# Patient Record
Sex: Female | Born: 1995 | Race: Black or African American | Hispanic: Yes | Marital: Single | Smoking: Never smoker
Health system: Southern US, Community
[De-identification: ages and names within clinical notes are randomized; demographics above are authoritative.]

## PROBLEM LIST (undated history)

## (undated) ENCOUNTER — Inpatient Hospital Stay (HOSPITAL_COMMUNITY): Payer: Self-pay

## (undated) DIAGNOSIS — R5383 Other fatigue: Secondary | ICD-10-CM

## (undated) DIAGNOSIS — N83209 Unspecified ovarian cyst, unspecified side: Secondary | ICD-10-CM

## (undated) DIAGNOSIS — F419 Anxiety disorder, unspecified: Secondary | ICD-10-CM

## (undated) HISTORY — PX: WISDOM TOOTH EXTRACTION: SHX21

---

## 2017-01-12 ENCOUNTER — Encounter (HOSPITAL_COMMUNITY): Payer: Self-pay

## 2017-01-12 ENCOUNTER — Inpatient Hospital Stay (HOSPITAL_COMMUNITY): Payer: Medicaid Other

## 2017-01-12 ENCOUNTER — Inpatient Hospital Stay (HOSPITAL_COMMUNITY)
Admission: AD | Admit: 2017-01-12 | Discharge: 2017-01-12 | Disposition: A | Discharge status: Home / Self Care | Payer: Medicaid Other | Source: Ambulatory Visit | Attending: Obstetrics and Gynecology | Admitting: Obstetrics and Gynecology

## 2017-01-12 DIAGNOSIS — Z3A10 10 weeks gestation of pregnancy: Secondary | ICD-10-CM

## 2017-01-12 DIAGNOSIS — O26899 Other specified pregnancy related conditions, unspecified trimester: Secondary | ICD-10-CM

## 2017-01-12 DIAGNOSIS — O219 Vomiting of pregnancy, unspecified: Secondary | ICD-10-CM

## 2017-01-12 DIAGNOSIS — D259 Leiomyoma of uterus, unspecified: Secondary | ICD-10-CM

## 2017-01-12 DIAGNOSIS — O23591 Infection of other part of genital tract in pregnancy, first trimester: Secondary | ICD-10-CM | POA: Diagnosis not present

## 2017-01-12 DIAGNOSIS — O210 Mild hyperemesis gravidarum: Secondary | ICD-10-CM | POA: Insufficient documentation

## 2017-01-12 DIAGNOSIS — B9689 Other specified bacterial agents as the cause of diseases classified elsewhere: Secondary | ICD-10-CM | POA: Diagnosis not present

## 2017-01-12 DIAGNOSIS — O3411 Maternal care for benign tumor of corpus uteri, first trimester: Secondary | ICD-10-CM | POA: Insufficient documentation

## 2017-01-12 DIAGNOSIS — O26891 Other specified pregnancy related conditions, first trimester: Secondary | ICD-10-CM

## 2017-01-12 DIAGNOSIS — N760 Acute vaginitis: Secondary | ICD-10-CM

## 2017-01-12 DIAGNOSIS — R109 Unspecified abdominal pain: Secondary | ICD-10-CM | POA: Diagnosis present

## 2017-01-12 DIAGNOSIS — Z3491 Encounter for supervision of normal pregnancy, unspecified, first trimester: Secondary | ICD-10-CM

## 2017-01-12 HISTORY — DX: Unspecified ovarian cyst, unspecified side: N83.209

## 2017-01-12 HISTORY — DX: Anxiety disorder, unspecified: F41.9

## 2017-01-12 LAB — WET PREP, GENITAL
SPERM: NONE SEEN
Trich, Wet Prep: NONE SEEN
Yeast Wet Prep HPF POC: NONE SEEN

## 2017-01-12 LAB — CBC
HCT: 36.6 % (ref 36.0–46.0)
Hemoglobin: 12.4 g/dL (ref 12.0–15.0)
MCH: 28.7 pg (ref 26.0–34.0)
MCHC: 33.9 g/dL (ref 30.0–36.0)
MCV: 84.7 fL (ref 78.0–100.0)
PLATELETS: 322 10*3/uL (ref 150–400)
RBC: 4.32 MIL/uL (ref 3.87–5.11)
RDW: 12.6 % (ref 11.5–15.5)
WBC: 4.7 10*3/uL (ref 4.0–10.5)

## 2017-01-12 LAB — URINALYSIS, ROUTINE W REFLEX MICROSCOPIC
BILIRUBIN URINE: NEGATIVE
Bacteria, UA: NONE SEEN
GLUCOSE, UA: NEGATIVE mg/dL
Hgb urine dipstick: NEGATIVE
KETONES UR: NEGATIVE mg/dL
LEUKOCYTES UA: NEGATIVE
Nitrite: NEGATIVE
PH: 5 (ref 5.0–8.0)
PROTEIN: 30 mg/dL — AB
Specific Gravity, Urine: 1.027 (ref 1.005–1.030)

## 2017-01-12 LAB — ABO/RH: ABO/RH(D): B POS

## 2017-01-12 LAB — POCT PREGNANCY, URINE: Preg Test, Ur: POSITIVE — AB

## 2017-01-12 LAB — HCG, QUANTITATIVE, PREGNANCY: hCG, Beta Chain, Quant, S: 147709 m[IU]/mL — ABNORMAL HIGH (ref <5)

## 2017-01-12 MED ORDER — METRONIDAZOLE 500 MG PO TABS: 500.0000 mg | ORAL_TABLET | Freq: Two times a day (BID) | ORAL | 0 refills | Status: AC

## 2017-01-12 MED ORDER — METOCLOPRAMIDE HCL 10 MG PO TABS: 10.0000 mg | ORAL_TABLET | Freq: Four times a day (QID) | ORAL | 0 refills | Status: AC

## 2017-01-12 NOTE — MAU Note (Signed)
Pt reports she is preg and is vomiting constantly. Lower abd pain x one week.

## 2017-01-12 NOTE — MAU Provider Note (Signed)
History     CSN: [id number]  Arrival date and time: 01/12/17 0254   First Provider Initiated Contact with Patient 01/12/17 [phone]      Chief Complaint  Patient presents with  . Possible Pregnancy  . Abdominal Pain  . Emesis   HPI Crystal Patton is a 21 y.o. G3P0020 at [redacted]w[redacted]d by LMP who presents with abdominal pain & n/v. Symptoms began the beginning of September. Reports daily nausea/vomiting; vomited 3 times per day. Was rx nausea meds by hospital [facility]; last took last night. Suprapubic cramping pain that is constant since September. Rates pain 7/10. Has not treated. Denies diarrhea, constipation, vaginal discharge, or vaginal bleeding.  OB History    Gravida Para Term Preterm AB Living   3       2     SAB TAB Ectopic Multiple Live Births   2              Past Medical History:  Diagnosis Date  . Anxiety   . Ovarian cyst     Past Surgical History:  Procedure Laterality Date  . WISDOM TOOTH EXTRACTION      History reviewed. No pertinent family history.  Social History  Substance Use Topics  . Smoking status: Never Smoker  . Smokeless tobacco: Never Used  . Alcohol use Yes     Comment: before pregnancy    Allergies: Allergies not on file  No prescriptions prior to admission.    Review of Systems  Constitutional: Negative.   Gastrointestinal: Positive for abdominal pain, nausea and vomiting. Negative for constipation and diarrhea.  Genitourinary: Negative for dysuria, hematuria, vaginal bleeding and vaginal discharge.   Physical Exam   Blood pressure 122/79, pulse 86, temperature 98.6 F (37 C), temperature source Oral, resp. rate 16, last menstrual period 11/03/2016, SpO2 99 %.  Physical Exam  Nursing note and vitals reviewed. Constitutional: She is oriented to person, place, and time. She appears well-developed and well-nourished. No distress.  HENT:  Head: Normocephalic and atraumatic.  Eyes: Conjunctivae are normal. Right eye exhibits no discharge.  Left eye exhibits no discharge. No scleral icterus.  Neck: Normal range of motion.  Cardiovascular: Normal rate, regular rhythm and normal heart sounds.   No murmur heard. Respiratory: Effort normal and breath sounds normal. No respiratory distress. She has no wheezes.  GI: Soft. Bowel sounds are normal. There is no tenderness. There is no rebound and no guarding.  Genitourinary: Uterus is enlarged (10-12 weeks). Cervix exhibits no motion tenderness.  Genitourinary Comments: Cervix closed/thick  Neurological: She is alert and oriented to person, place, and time.  Skin: Skin is warm and dry. She is not diaphoretic.  Psychiatric: She has a normal mood and affect. Her behavior is normal. Judgment and thought content normal.    MAU Course  Procedures Results for orders placed or performed during the hospital encounter of 01/12/17 (from the past 24 hour(s))  Urinalysis, Routine w reflex microscopic     Status: Abnormal   Collection Time: 01/12/17  8:50 AM  Result Value Ref Range   Color, Urine AMBER (A) YELLOW   APPearance HAZY (A) CLEAR   Specific Gravity, Urine 1.027 1.005 - 1.030   pH 5.0 5.0 - 8.0   Glucose, UA NEGATIVE NEGATIVE mg/dL   Hgb urine dipstick NEGATIVE NEGATIVE   Bilirubin Urine NEGATIVE NEGATIVE   Ketones, ur NEGATIVE NEGATIVE mg/dL   Protein, ur 30 (A) NEGATIVE mg/dL   Nitrite NEGATIVE NEGATIVE   Leukocytes, UA NEGATIVE NEGATIVE  RBC / HPF 0-5 0 - 5 RBC/hpf   WBC, UA 6-30 0 - 5 WBC/hpf   Bacteria, UA NONE SEEN NONE SEEN   Squamous Epithelial / LPF 6-30 (A) NONE SEEN   Mucus PRESENT   Pregnancy, urine POC     Status: Abnormal   Collection Time: 01/12/17  9:03 AM  Result Value Ref Range   Preg Test, Ur POSITIVE (A) NEGATIVE  Wet prep, genital     Status: Abnormal   Collection Time: 01/12/17  9:42 AM  Result Value Ref Range   Yeast Wet Prep HPF POC NONE SEEN NONE SEEN   Trich, Wet Prep NONE SEEN NONE SEEN   Clue Cells Wet Prep HPF POC PRESENT (A) NONE SEEN    WBC, Wet Prep HPF POC FEW (A) NONE SEEN   Sperm NONE SEEN   CBC     Status: None   Collection Time: 01/12/17  9:45 AM  Result Value Ref Range   WBC 4.7 4.0 - 10.5 K/uL   RBC 4.32 3.87 - 5.11 MIL/uL   Hemoglobin 12.4 12.0 - 15.0 g/dL   HCT 36.6 36.0 - 46.0 %   MCV 84.7 78.0 - 100.0 fL   MCH 28.7 26.0 - 34.0 pg   MCHC 33.9 30.0 - 36.0 g/dL   RDW 12.6 11.5 - 15.5 %   Platelets 322 150 - 400 K/uL  ABO/Rh     Status: None   Collection Time: 01/12/17  9:45 AM  Result Value Ref Range   ABO/RH(D) B POS   hCG, quantitative, pregnancy     Status: Abnormal   Collection Time: 01/12/17  9:45 AM  Result Value Ref Range   hCG, Beta Chain, Quant, S 147,709 (H) <5 mIU/mL   US Ob Comp Less 14 Wks  Result Date: 01/12/2017 CLINICAL DATA:  Lower pelvic pain for 1 week EXAM: OBSTETRIC <14 WK ULTRASOUND TECHNIQUE: Transabdominal ultrasound was performed for evaluation of the gestation as well as the maternal uterus and adnexal regions. COMPARISON:  None. FINDINGS: Intrauterine gestational sac: Single Yolk sac:  Visualized Embryo:  Visualized Cardiac Activity: Visualized Heart Rate: 168 bpm MSD:   mm    w     d CRL:   31.8  mm   10 w 0 d                  [place] EDC: 08/10/2017 Subchorionic hemorrhage:  None visualized. Maternal uterus/adnexae: Single uterine fibroid measures up to 2.9 cm. No adnexal masses or free fluid. IMPRESSION: Ten week intrauterine pregnancy. Fetal heart rate 168 beats per minute. No acute maternal findings. Electronically Signed   By: Rolm Baptise M.D.   On: 01/12/2017 10:40    MDM +UPT UA, wet prep, GC/chlamydia, CBC, ABO/Rh, quant hCG, HIV, and [place] today to rule out ectopic pregnancy B positive Ultrasound shows SIUP measuring 10 wks with cardiac activity Assessment and Plan  A: 1. Normal IUP (intrauterine pregnancy) on prenatal ultrasound, first trimester   2. Abdominal pain affecting pregnancy   3. [redacted] weeks gestation of pregnancy   4. BV (bacterial vaginosis)   5. Uterine  fibroids affecting pregnancy in first trimester   6. Nausea and vomiting in pregnancy prior to [redacted] weeks gestation    P: Discharge home Rx flagyl & reglan Discussed reasons to return to MAU Start prenatal care GC/CT pending   Jorje Guild 01/12/2017, 9:33 AM

## 2017-01-12 NOTE — Discharge Instructions (Signed)
Morning Sickness Morning sickness is when you feel sick to your stomach (nauseous) during pregnancy. This nauseous feeling may or may not come with vomiting. It often occurs in the morning but can be a problem any time of day. Morning sickness is most common during the first trimester, but it may continue throughout pregnancy. While morning sickness is unpleasant, it is usually harmless unless you develop severe and continual vomiting (hyperemesis gravidarum). This condition requires more intense treatment. What are the causes? The cause of morning sickness is not completely known but seems to be related to normal hormonal changes that occur in pregnancy. What increases the risk? You are at greater risk if you:  Experienced nausea or vomiting before your pregnancy.  Had morning sickness during a previous pregnancy.  Are pregnant with more than one baby, such as twins.  How is this treated? Do not use any medicines (prescription, over-the-counter, or herbal) for morning sickness without first talking to your health care provider. Your health care provider may prescribe or recommend:  Vitamin B6 supplements.  Anti-nausea medicines.  The herbal medicine ginger.  Follow these instructions at home:  Only take over-the-counter or prescription medicines as directed by your health care provider.  Taking multivitamins before getting pregnant can prevent or decrease the severity of morning sickness in most women.  Eat a piece of dry toast or unsalted crackers before getting out of bed in the morning.  Eat five or six small meals a day.  Eat dry and bland foods (rice, baked potato). Foods high in carbohydrates are often helpful.  Do not drink liquids with your meals. Drink liquids between meals.  Avoid greasy, fatty, and spicy foods.  Get someone to cook for you if the smell of any food causes nausea and vomiting.  If you feel nauseous after taking prenatal vitamins, take the vitamins at  night or with a snack.  Snack on protein foods (nuts, yogurt, cheese) between meals if you are hungry.  Eat unsweetened gelatins for desserts.  Wearing an acupressure wristband (worn for sea sickness) may be helpful.  Acupuncture may be helpful.  Do not smoke.  Get a humidifier to keep the air in your house free of odors.  Get plenty of fresh air. Contact a health care provider if:  Your home remedies are not working, and you need medicine.  You feel dizzy or lightheaded.  You are losing weight. Get help right away if:  You have persistent and uncontrolled nausea and vomiting.  You pass out (faint). This information is not intended to replace advice given to you by your health care provider. Make sure you discuss any questions you have with your health care provider. Document Released: 04/30/2006 Document Revised: 08/15/2015 Document Reviewed: 08/24/2012 Elsevier Interactive Patient Education  2017 Elsevier Inc.  Bacterial Vaginosis Bacterial vaginosis is a vaginal infection that occurs when the normal balance of bacteria in the vagina is disrupted. It results from an overgrowth of certain bacteria. This is the most common vaginal infection among women ages 3-44. Because bacterial vaginosis increases your risk for STIs (sexually transmitted infections), getting treated can help reduce your risk for chlamydia, gonorrhea, herpes, and HIV (human immunodeficiency virus). Treatment is also important for preventing complications in pregnant women, because this condition can cause an early (premature) delivery. What are the causes? This condition is caused by an increase in harmful bacteria that are normally present in small amounts in the vagina. However, the reason that the condition develops is not fully understood.  What increases the risk? The following factors may make you more likely to develop this condition:  Having a new sexual partner or multiple sexual partners.  Having  unprotected sex.  Douching.  Having an intrauterine device (IUD).  Smoking.  Drug and alcohol abuse.  Taking certain antibiotic medicines.  Being pregnant.  You cannot get bacterial vaginosis from toilet seats, bedding, swimming pools, or contact with objects around you. What are the signs or symptoms? Symptoms of this condition include:  Grey or white vaginal discharge. The discharge can also be watery or foamy.  A fish-like odor with discharge, especially after sexual intercourse or during menstruation.  Itching in and around the vagina.  Burning or pain with urination.  Some women with bacterial vaginosis have no signs or symptoms. How is this diagnosed? This condition is diagnosed based on:  Your medical history.  A physical exam of the vagina.  Testing a sample of vaginal fluid under a microscope to look for a large amount of bad bacteria or abnormal cells. Your health care provider may use a cotton swab or a small wooden spatula to collect the sample.  How is this treated? This condition is treated with antibiotics. These may be given as a pill, a vaginal cream, or a medicine that is put into the vagina (suppository). If the condition comes back after treatment, a second round of antibiotics may be needed. Follow these instructions at home: Medicines  Take over-the-counter and prescription medicines only as told by your health care provider.  Take or use your antibiotic as told by your health care provider. Do not stop taking or using the antibiotic even if you start to feel better. General instructions  If you have a female sexual partner, tell her that you have a vaginal infection. She should see her health care provider and be treated if she has symptoms. If you have a female sexual partner, he does not need treatment.  During treatment: ? Avoid sexual activity until you finish treatment. ? Do not douche. ? Avoid alcohol as directed by your health care  provider. ? Avoid breastfeeding as directed by your health care provider.  Drink enough water and fluids to keep your urine clear or pale yellow.  Keep the area around your vagina and rectum clean. ? Wash the area daily with warm water. ? Wipe yourself from front to back after using the toilet.  Keep all follow-up visits as told by your health care provider. This is important. How is this prevented?  Do not douche.  Wash the outside of your vagina with warm water only.  Use protection when having sex. This includes latex condoms and dental dams.  Limit how many sexual partners you have. To help prevent bacterial vaginosis, it is best to have sex with just one partner (monogamous).  Make sure you and your sexual partner are tested for STIs.  Wear cotton or cotton-lined underwear.  Avoid wearing tight pants and pantyhose, especially during summer.  Limit the amount of alcohol that you drink.  Do not use any products that contain nicotine or tobacco, such as cigarettes and e-cigarettes. If you need help quitting, ask your health care provider.  Do not use illegal drugs. Where to find more information:  Centers for Disease Control and Prevention: AppraiserFraud.fi  American Sexual Health Association (ASHA): www.ashastd.org  U.S. Department of Health and Financial controller, Office on Women's Health: DustingSprays.pl or SecuritiesCard.it Contact a health care provider if:  Your symptoms do not improve, even  after treatment.  You have more discharge or pain when urinating.  You have a fever.  You have pain in your abdomen.  You have pain during sex.  You have vaginal bleeding between periods. Summary  Bacterial vaginosis is a vaginal infection that occurs when the normal balance of bacteria in the vagina is disrupted.  Because bacterial vaginosis increases your risk for STIs (sexually transmitted infections), getting treated can  help reduce your risk for chlamydia, gonorrhea, herpes, and HIV (human immunodeficiency virus). Treatment is also important for preventing complications in pregnant women, because the condition can cause an early (premature) delivery.  This condition is treated with antibiotic medicines. These may be given as a pill, a vaginal cream, or a medicine that is put into the vagina (suppository). This information is not intended to replace advice given to you by your health care provider. Make sure you discuss any questions you have with your health care provider. Document Released: 03/09/2005 Document Revised: 11/23/2015 Document Reviewed: 11/23/2015 Elsevier Interactive Patient Education  2017 [employer].

## 2017-01-13 LAB — CULTURE, OB URINE: Culture: NO GROWTH

## 2017-01-13 LAB — GC/CHLAMYDIA PROBE AMP (~~LOC~~) NOT AT ARMC
Chlamydia: NEGATIVE
NEISSERIA GONORRHEA: NEGATIVE

## 2017-11-02 ENCOUNTER — Encounter (HOSPITAL_COMMUNITY): Payer: Self-pay

## 2018-06-11 IMAGING — US US OB COMP LESS 14 WK
1 series · 15 of 28 positions shown · non-contrast
Comparison: None.

CLINICAL DATA: Lower pelvic pain for 1 week

EXAM:
OBSTETRIC <14 WK ULTRASOUND
TECHNIQUE: Transabdominal ultrasound was performed for evaluation of the
gestation as well as the maternal uterus and adnexal regions.

[Series 1: us ob comp less 14 wk · 15 of 40 slices shown]
[im 1/40]
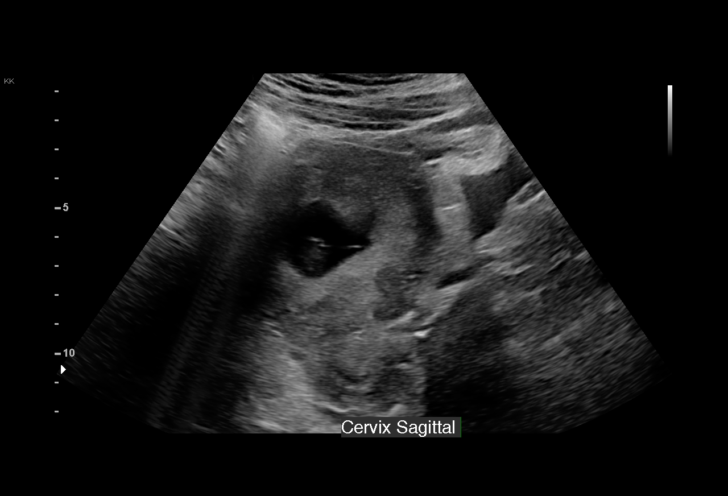
[im 3/40]
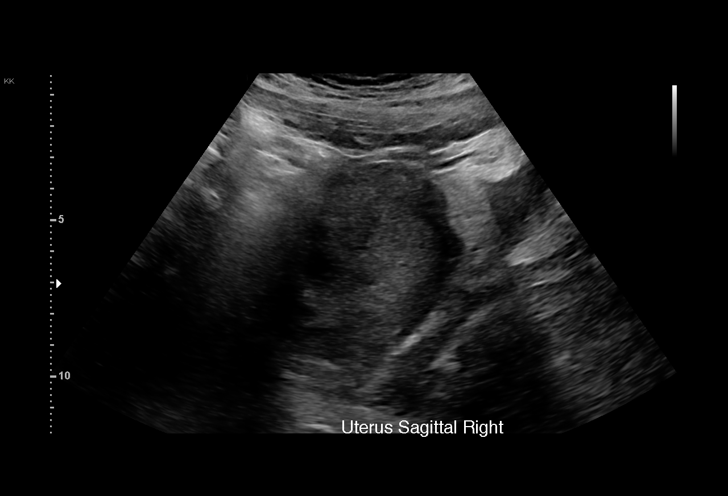
[im 6/40]
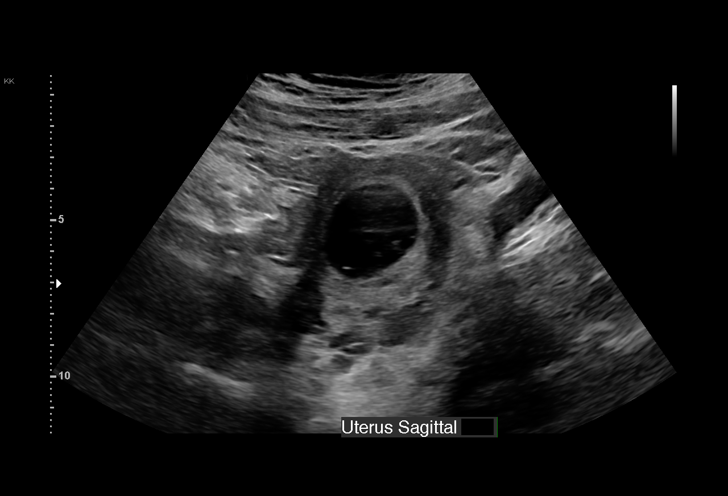
[im 9/40]
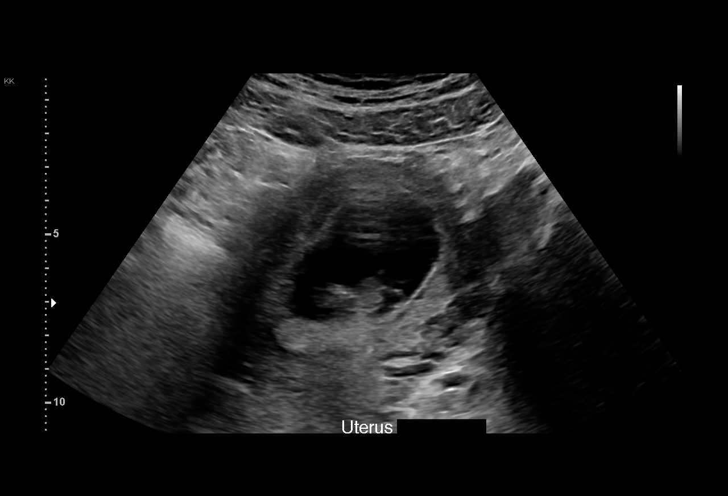
[im 12/40]
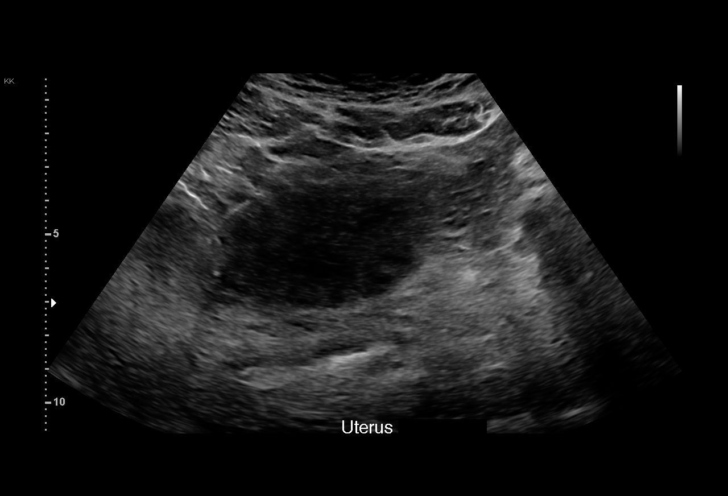
[im 15/40]
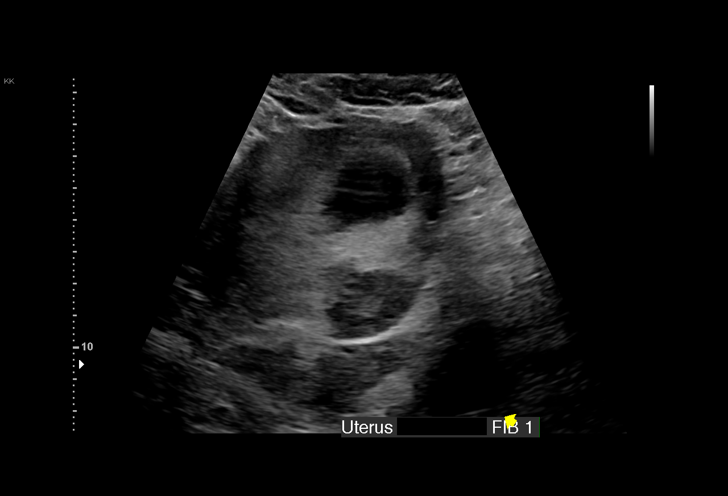
[im 18/40]
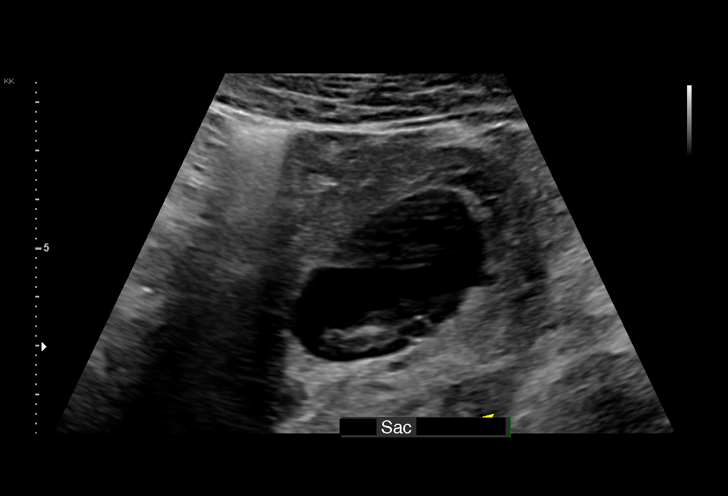
[im 21/40]
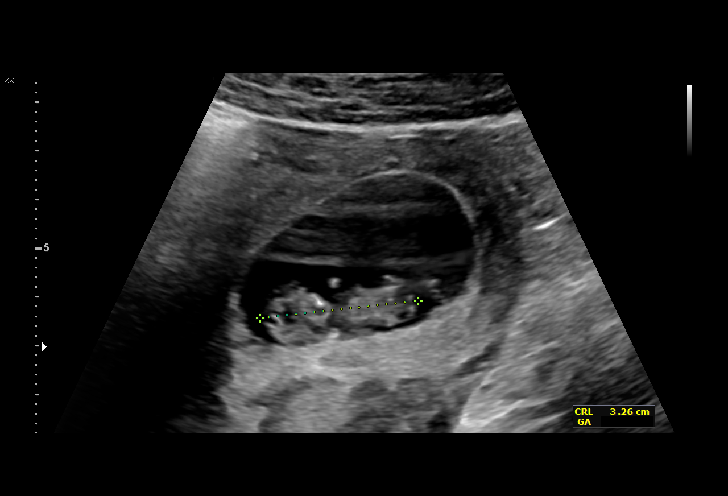
[im 22/40]
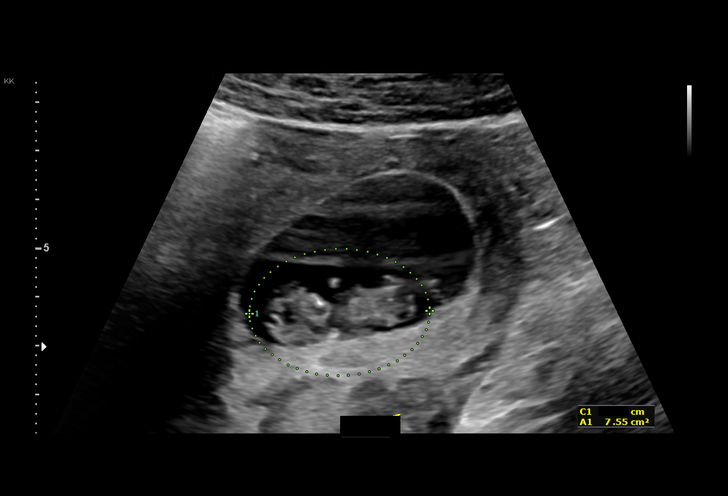
[im 25/40]
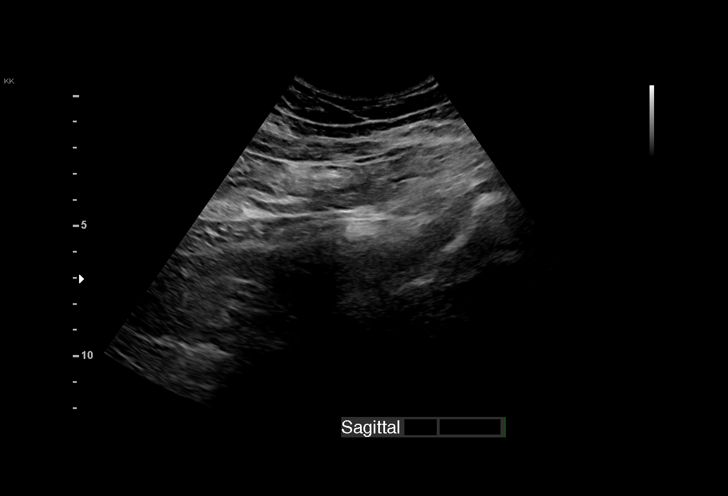
[im 28/40]
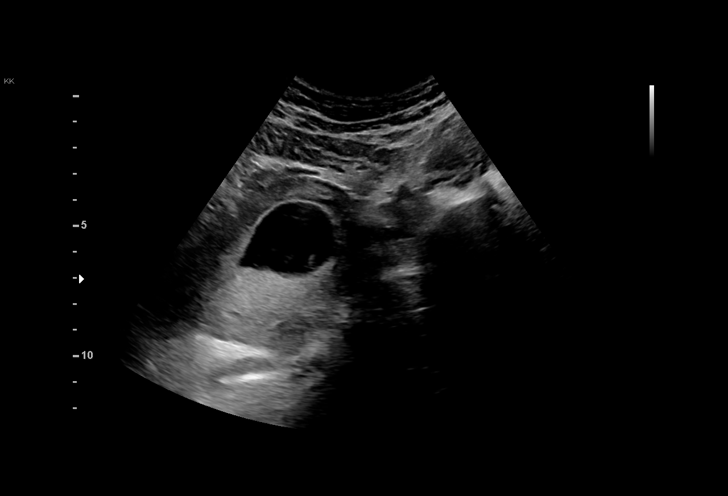
[im 31/40]
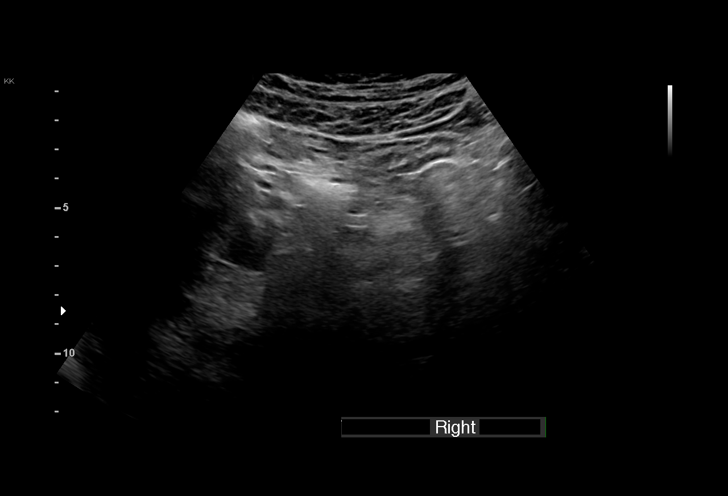
[im 34/40]
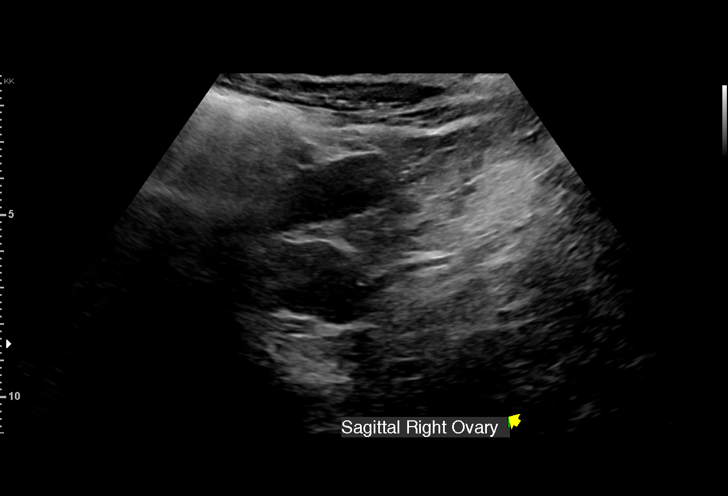
[im 37/40]
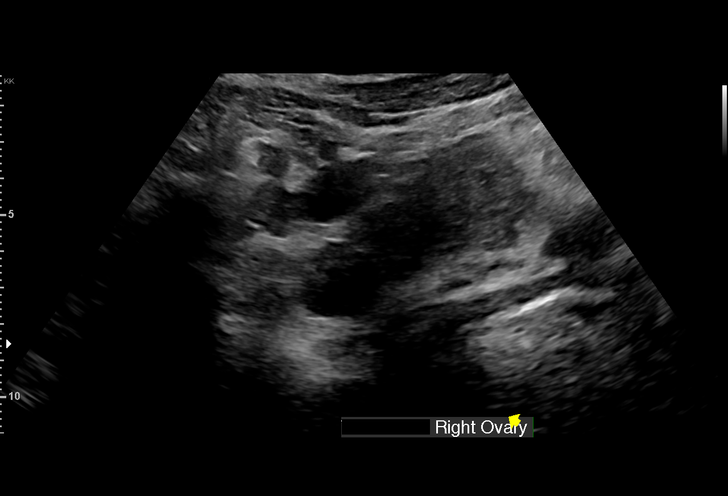
[im 40/40]
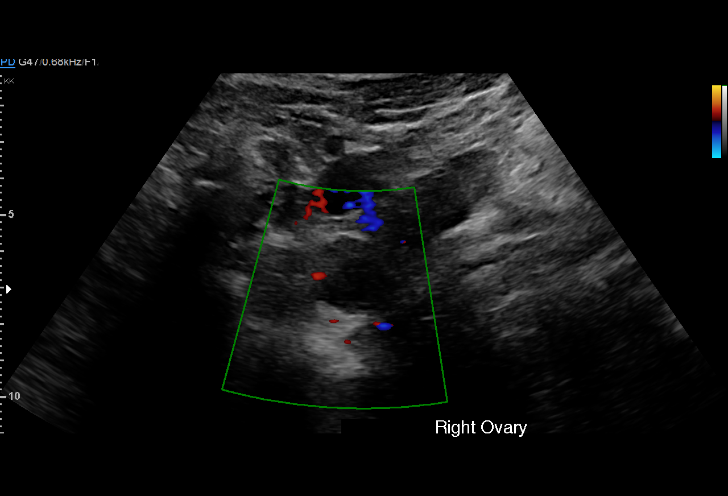

[15 of 28 positions shown; findings below may reference images not displayed]

FINDINGS: Intrauterine gestational sac: Single

Yolk sac:  Visualized

Embryo:  Visualized

Cardiac Activity: Visualized

Heart Rate: 168 bpm

MSD:   mm    w     d

CRL:   31.8  mm   10 w 0 d                  US EDC: 08/10/2017

Subchorionic hemorrhage:  None visualized.

Maternal uterus/adnexae: Single uterine fibroid measures up to
cm. No adnexal masses or free fluid.
IMPRESSION: Ten week intrauterine pregnancy. Fetal heart rate 168 beats per
minute. No acute maternal findings.

## 2021-09-18 NOTE — Progress Notes (Signed)
Formatting of this note might be different from the original.  First Steps postpartum nurse visit completed via telephone on 09/18/21 at 1000 with patient present. Infant is exclusively breast feeding and tolerating well. Infant is being followed by [facility]. Patient is recovering well with no complaints or concerns. Edinburgh Postnatal Depression Scale screener not completed; patient reports she is feeling well emotionally and denies concerns at this time. Patient is Single (1), FOB lives in home and is involved in newborn care.  Patient states that she has her 6 week postpartum appointment scheduled and will follow up with her OB at that time and as needed. Call parameters were reviewed. Patient expressed understanding. Patient denies further questions/concerns at this time. General education/resources provided and sent via e-mail.    Electronically signed by Forde Radon, RN at 09/18/2021  2:55 PM EDT

## 2022-10-23 ENCOUNTER — Inpatient Hospital Stay
Admit: 2022-10-23 | Discharge: 2022-10-24 | Disposition: A | Discharge status: Home / Self Care | Attending: Emergency Medicine

## 2022-10-23 ENCOUNTER — Emergency Department: Admit: 2022-10-23

## 2022-10-23 DIAGNOSIS — R5383 Other fatigue: Secondary | ICD-10-CM

## 2022-10-23 LAB — CBC WITH AUTO DIFFERENTIAL
Basophils %: 1 % (ref 0–2)
Basophils Absolute: 0.03 10*3/uL (ref 0.00–0.20)
Eosinophils %: 2 % (ref 1–4)
Eosinophils Absolute: 0.1 10*3/uL (ref 0.00–0.44)
Hematocrit: 39.9 % (ref 36.3–47.1)
Hemoglobin: 12.5 g/dL (ref 11.9–15.1)
Immature Granulocytes %: 0 %
Immature Granulocytes Absolute: <0.03 10*3/uL (ref 0.00–0.30)
Lymphocytes %: 37 % (ref 24–43)
Lymphocytes Absolute: 2.3 10*3/uL (ref 1.10–3.70)
MCH: 28.9 pg (ref 25.2–33.5)
MCHC: 31.3 g/dL (ref 28.4–34.8)
MCV: 92.1 fL (ref 82.6–102.9)
MPV: 11.3 fL (ref 8.1–13.5)
Monocytes %: 8 % (ref 3–12)
Monocytes Absolute: 0.47 10*3/uL (ref 0.10–1.20)
NRBC Automated: 0 per 100 WBC
Neutrophils %: 52 % (ref 36–65)
Neutrophils Absolute: 3.29 10*3/uL (ref 1.50–8.10)
Platelets: 281 10*3/uL (ref 138–453)
RBC: 4.33 m/uL (ref 3.95–5.11)
RDW: 13.1 % (ref 11.8–14.4)
WBC: 6.2 10*3/uL (ref 3.5–11.3)

## 2022-10-23 LAB — BASIC METABOLIC PANEL
Anion Gap: 10 mmol/L (ref 9–16)
BUN: 10 mg/dL (ref 6–20)
CO2: 24 mmol/L (ref 20–31)
Calcium: 9.4 mg/dL (ref 8.6–10.4)
Chloride: 104 mmol/L (ref 98–107)
Creatinine: 0.7 mg/dL (ref 0.50–0.90)
Est, Glom Filt Rate: >90 mL/min/{1.73_m2} (ref >60)
Glucose: 70 mg/dL — ABNORMAL LOW (ref 74–99)
Potassium: 3.8 mmol/L (ref 3.7–5.3)
Sodium: 138 mmol/L (ref 136–145)

## 2022-10-23 LAB — TROPONIN: Troponin, High Sensitivity: <6 ng/L (ref 0–14)

## 2022-10-23 MED ORDER — SODIUM CHLORIDE 0.9 % IV BOLUS
0.9 | Freq: Once | INTRAVENOUS | Status: AC
Start: 2022-10-23 — End: 2022-10-23
  Administered 2022-10-23: 23:00:00 1000 mL via INTRAVENOUS

## 2022-10-23 NOTE — Discharge Instructions (Signed)
You were seen evaluated for fatigue, anxiety, chest pain.  Our workup showed no abnormalities in your blood tests or x-rays or your electrical pictures of your heart.  Your symptoms may or may not be related to anxiety.  Please consider improved sleep hygiene, healthy coping mechanisms such as exercise, healthy diet, and healthier alternatives to stress reduction.  Please follow-up with your primary care provider in order to discuss your symptoms, this ER visit, ER laboratory workup.  Please return to the ER for any sudden chest pain, shortness of breath, or to answer any other questions or concerns.

## 2022-10-23 NOTE — ED Notes (Signed)
Patient's BS 70. Writer gave patient orange juice and box lunch.

## 2022-10-23 NOTE — ED Provider Notes (Signed)
[facility] ED     Emergency Department     Faculty Attestation    I performed a history and physical examination of the patient and discussed management with the resident. I reviewed the resident's note and agree with the documented findings and plan of care. Any areas of disagreement are noted on the chart. I was personally present for the key portions of any procedures. I have documented in the chart those procedures where I was not present during the key portions. I have reviewed the emergency nurses triage note. I agree with the chief complaint, past medical history, past surgical history, allergies, medications, social and family history as documented unless otherwise noted below. For Physician Assistant/ Nurse Practitioner cases/documentation I have personally evaluated this patient and have completed at least one if not all key elements of the E/M (history, physical exam, and MDM). Additional findings are as noted.    7:01 PM EDT    Patient presents because she has been having episodes of chest pain and then had a near syncopal event today.  She says that she will occasionally have chest pain that starts in the center of her chest and then radiates to both sides.  She denies any chest pain currently.  She says that today she felt a little lightheaded and went to the bathroom to sit down when her vision went black.  She says that she still was aware of what was going on around her during that time.  She denies any recent fever, cold or flulike symptoms.  She denies any headache.  She denies any vomiting or diarrhea.  On exam, patient is resting comfortably in the bed and appears well.  She is alert and oriented and answers questions appropriately.  There are no focal neurologic deficits.  Lungs clear to auscultation bilaterally and heart sounds are normal.  Abdomen is soft and nontender.  The bilateral calves are nontender nonswollen.  Will get EKG and labs and reassess.    EKG  Interpretation    Interpreted by emergency department physician    Rhythm: sinus bradycardia  Rate: 50-60  Axis: normal  Ectopy: none  Conduction: normal  ST Segments: normal  T Waves: non specific changes  Q Waves: none    Clinical Impression: non-specific EKG and sinus bradycardia    Jesus Genera, MD        Melynda Keller, MD  Attending Emergency  Physician

## 2022-10-23 NOTE — ED Triage Notes (Signed)
27 yo female arrived to ED through triage with c/o blacked out at work.  Patient states she has been feeling under the weather for the last week with fatigue and chest pain.  Patient states she loss consciousness while sitting in the restroom at work and unsure how long she was out.   Patient is alert and oriented times 4, speaking full sentences, and answering questions appropriately.    Dr Gustavus Messing at bedside

## 2022-10-23 NOTE — ED Notes (Signed)
X-ray at bedside

## 2022-10-23 NOTE — ED Notes (Signed)
FSBS 117

## 2022-10-23 NOTE — ED Provider Notes (Signed)
[facility] ED  Emergency Department Encounter  Emergency Medicine Resident     Pt Name:Katherine Lam  MRN: [id number]  Birthdate 03/14/1996  Date of evaluation: 10/23/22  PCP:  No primary care provider on file.  Note Started: 7:02 PM EDT      CHIEF COMPLAINT       Chief Complaint   Patient presents with    Loss of Consciousness    Chest Pain       HISTORY OF PRESENT ILLNESS  (Location/Symptom, Timing/Onset, Context/Setting, Quality, Duration, Modifying Factors, Severity.)      Katherine Lam is a 27 y.o. female with PMH of anxiety who presents with 1 week of generalized fatigue, anxiety.  Patient states she has had intermittent feelings of anxiety, fatigue and chest pain.  Stated that today she was at work and went to sit on toilet and report restroom at work and "zoned out."  Patient estimates that time elapsed was approximately.  Patient states that similar episode happened when she was 27 years old and she was diagnosed with hypokalemia.  Patient states symptoms seem to be alleviated by her smoking cannabis.  Patient verbalizing wanting to have electrolytes and hemoglobin check as she was diagnosed with transient anemia during her pregnancy approximately 1 year ago.    PAST MEDICAL / SURGICAL / SOCIAL / FAMILY HISTORY      has no past medical history on file.       has no past surgical history on file.      Social History     Socioeconomic History    Marital status: Single     Spouse name: Not on file    Number of children: Not on file    Years of education: Not on file    Highest education level: Not on file   Occupational History    Not on file   Tobacco Use    Smoking status: Not on file    Smokeless tobacco: Not on file   Substance and Sexual Activity    Alcohol use: Not on file    Drug use: Not on file    Sexual activity: Not on file   Other Topics Concern    Not on file   Social History Narrative    Not on file     Social Determinants of Health     Financial Resource Strain: Not on file    Food Insecurity: Not on file   Transportation Needs: Not on file   Physical Activity: Not on file   Stress: Not on file   Social Connections: Not on file   Intimate Partner Violence: Not on file   Housing Stability: Not on file       History reviewed. No pertinent family history.    Allergies:  Patient has no known allergies.    Home Medications:  Prior to Admission medications    Not on File       REVIEW OF SYSTEMS       Review of Systems   Constitutional:  Positive for fatigue.   HENT: Negative.     Eyes: Negative.    Respiratory: Negative.     Cardiovascular:  Positive for chest pain.        Intermittent, 5/10, radiating up into manubrium and laterally bilaterally at xiphoid level   Gastrointestinal: Negative.    Endocrine: Negative.    Genitourinary: Negative.    Musculoskeletal: Negative.    Skin: Negative.    Allergic/Immunologic: Negative.  Neurological:  Positive for headaches.   Hematological: Negative.    Psychiatric/Behavioral: Negative.         PHYSICAL EXAM      INITIAL VITALS:   BP 106/68   Pulse 73   Temp 99.1 F (37.3 C) (Oral)   Resp 17   Ht 1.626 m (5\' 4" )   Wt 64 kg (141 lb)   LMP 10/05/2022   SpO2 100%   BMI 24.20 kg/m     Physical Exam  Constitutional:       Appearance: Normal appearance.   HENT:      Head: Normocephalic and atraumatic.      Nose: Nose normal.      Mouth/Throat:      Mouth: Mucous membranes are moist.   Eyes:      General: No scleral icterus.     Extraocular Movements: Extraocular movements intact.      Pupils: Pupils are equal, round, and reactive to light.   Cardiovascular:      Rate and Rhythm: Normal rate and regular rhythm.      Pulses: Normal pulses.      Heart sounds: Normal heart sounds. No murmur heard.  Pulmonary:      Effort: Pulmonary effort is normal.      Breath sounds: Normal breath sounds.   Abdominal:      General: Abdomen is flat. There is no distension.      Palpations: Abdomen is soft.      Tenderness: There is no abdominal tenderness.    Musculoskeletal:         General: Normal range of motion.   Skin:     General: Skin is warm and dry.      Capillary Refill: Capillary refill takes less than 2 seconds.   Neurological:      General: No focal deficit present.      Mental Status: She is alert and oriented to person, place, and time.   Psychiatric:         Mood and Affect: Mood normal.           DDX/DIAGNOSTIC RESULTS / EMERGENCY DEPARTMENT COURSE / MDM     Medical Decision Making  27 y.o. female with PMH of anxiety who presents with 1 week of generalized fatigue, anxiety.  Patient states she has had intermittent feelings of anxiety, fatigue and chest pain.  Stated that today she was at work and went to sit on toilet and report restroom at work and "zoned out."  Patient estimates that time elapsed was approximately.  Patient states that similar episode happened when she was 27 years old and she was diagnosed with hypokalemia.  Patient states symptoms seem to be alleviated by her smoking cannabis.  Patient verbalizing wanting to have electrolytes and hemoglobin check as she was diagnosed with transient anemia during her pregnancy approximately 1 year ago.    Given patient PMH, HPI, physical exam, differential diagnosis includes ACS, anxiety, electrolyte derangement, poor sleep hygiene, and stre  Will workup with cardiac workup including CBC, BMP, CXR, EKG, troponin.  Will additionally provide IV fluid hydration.     Amount and/or Complexity of Data Reviewed  Labs: ordered. Decision-making details documented in ED Course.  Radiology: ordered. Decision-making details documented in ED Course.  ECG/medicine tests: ordered.    Risk  Prescription drug management.        EKG  Sinus bradycardia with ventricular rate of 57 bpm, QRS 64 QTc 375, normal axis,    All EKG's  are interpreted by the Emergency Department Physician who either signs or Co-signs this chart in the absence of a cardiologist.    EMERGENCY DEPARTMENT COURSE:      ED Course as of 10/23/22 2205    Fri Oct 23, 2022   1921 CBC with Auto Differential:    WBC 6.2   RBC 4.33   Hemoglobin Quant 12.5   Hematocrit 39.9   MCV 92.1   MCH 28.9   MCHC 31.3   RDW 13.1   Platelet Count 281   MPV 11.3   NRBC Automated 0.0   Neutrophils % 52   Lymphocyte % 37   Monocytes % 8   Eosinophils % 2   Basophils % 1   Immature Granulocytes % 0   Neutrophils Absolute 3.29   Lymphocytes Absolute 2.30   Monocytes Absolute 0.47   Eosinophils Absolute 0.10   Basophils Absolute 0.03   Immature Granulocytes Absolute <0.03 [SP]   1933 Glucose(!): 70 [SP]   1934 Troponin, High Sensitivity: <6 [SP]   2029 POC Glucose(!): 117  Repeat glucose 117 after having meal. [SP]   2032 XR CHEST PORTABLE  No acute process. [SP]   2157 Preg, Serum: NEGATIVE [SP]      ED Course User Index  [SP] Lovette Cliche, MD       PROCEDURES:      CONSULTS:  None    CRITICAL CARE:  There was significant risk of life threatening deterioration of patient's condition requiring my direct management. Critical care time minutes, excluding any documented procedures.    FINAL IMPRESSION      1. Fatigue, unspecified type    2. Other chest pain    3. Anxiety state          DISPOSITION / PLAN     DISPOSITION Decision To Discharge 10/23/2022 10:05:00 PM      PATIENT REFERRED TO:  [facility] ED  [address]  [place] [address]  [phone]    As needed, If symptoms worsen      DISCHARGE MEDICATIONS:  New Prescriptions    No medications on file       Lovette Cliche, MD  Emergency Medicine Resident    (Please note that portions of thisnote were completed with a voice recognition program.  Efforts were made to edit the dictations but occasionally words are mis-transcribed.)

## 2022-10-24 LAB — POC GLUCOSE FINGERSTICK: POC Glucose: 117 mg/dL — ABNORMAL HIGH (ref 65–105)

## 2022-10-24 LAB — HCG, SERUM, QUALITATIVE: Preg, Serum: NEGATIVE

## 2022-10-25 LAB — EKG 12-LEAD
Atrial Rate: 57 {beats}/min
P Axis: 48 degrees
P-R Interval: 170 ms
Q-T Interval: 386 ms
QRS Duration: 64 ms
QTc Calculation (Bazett): 375 ms
R Axis: 67 degrees
T Axis: 55 degrees
Ventricular Rate: 57 {beats}/min
